# Patient Record
Sex: Male | Born: 1965 | Race: Black or African American | Hispanic: No | Marital: Married
Health system: Southern US, Community
[De-identification: ages and names within clinical notes are randomized; demographics above are authoritative.]

---

## 2004-04-20 ENCOUNTER — Inpatient Hospital Stay (HOSPITAL_COMMUNITY): Admission: EM | Admit: 2004-04-20 | Discharge: 2004-04-21 | Payer: Self-pay | Admitting: Emergency Medicine

## 2013-03-16 ENCOUNTER — Ambulatory Visit
Admission: RE | Admit: 2013-03-16 | Discharge: 2013-03-16 | Disposition: A | Discharge status: Home / Self Care | Payer: BC Managed Care – PPO | Source: Ambulatory Visit | Attending: Chiropractic Medicine | Admitting: Chiropractic Medicine

## 2013-03-16 ENCOUNTER — Other Ambulatory Visit: Payer: Self-pay | Admitting: Chiropractic Medicine

## 2013-03-16 DIAGNOSIS — M549 Dorsalgia, unspecified: Secondary | ICD-10-CM

## 2013-03-16 DIAGNOSIS — M542 Cervicalgia: Secondary | ICD-10-CM

## 2014-12-16 IMAGING — CR DG CERVICAL SPINE 2 OR 3 VIEWS
4 series · 4 of 4 positions shown · non-contrast
Comparison: None.

CLINICAL DATA: Neck pain. Right shoulder tenderness.  No trauma.

CERVICAL SPINE - 2-3 VIEW

[view not recorded (1 of 4)]
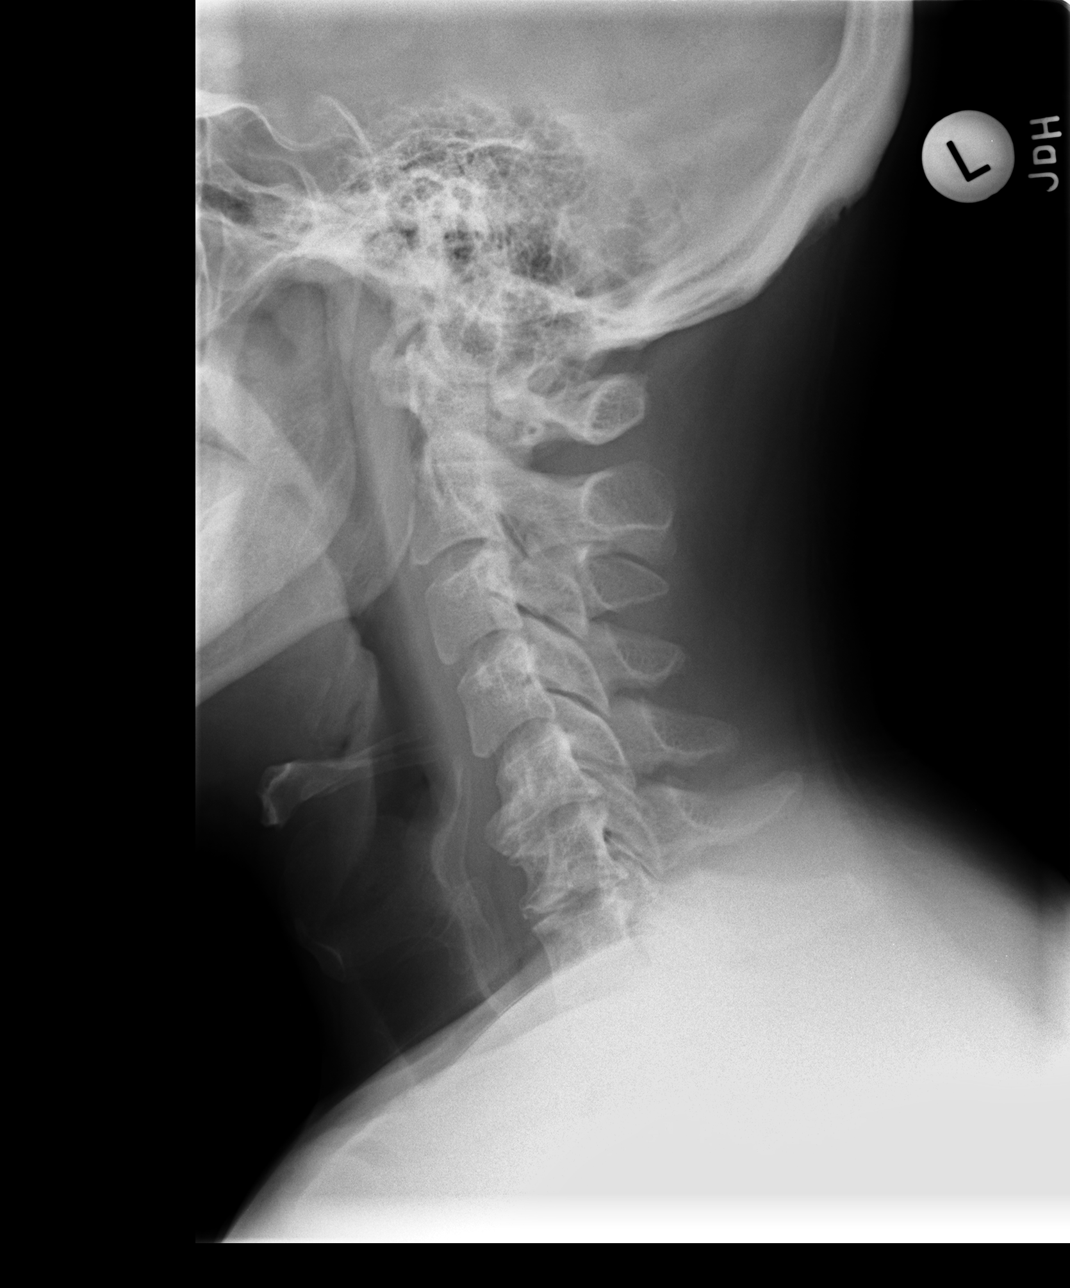

[view not recorded (2 of 4)]
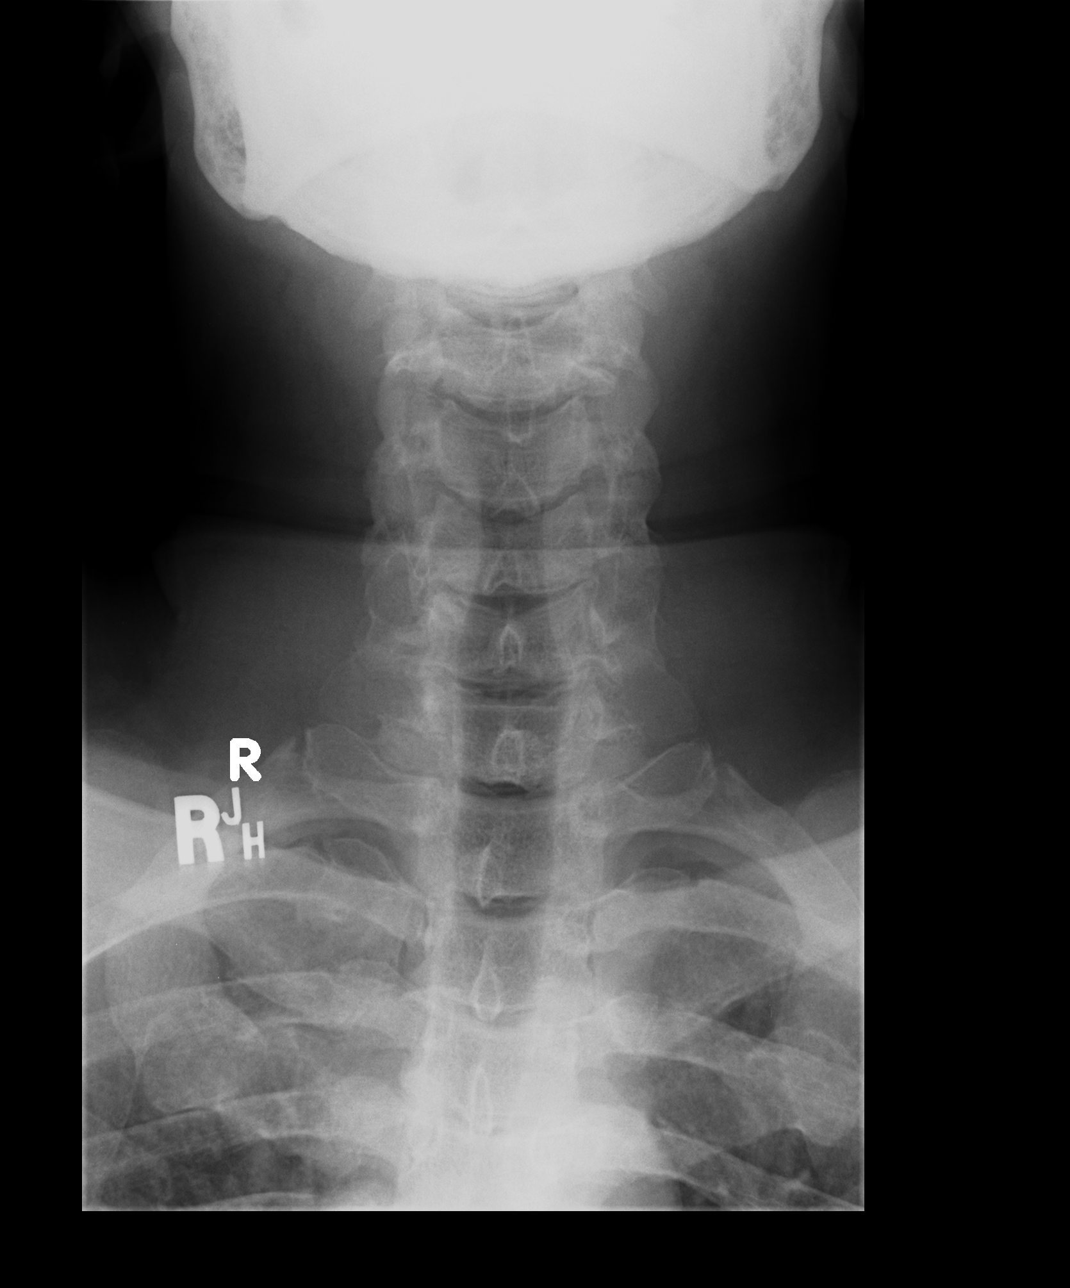

[view not recorded (3 of 4)]
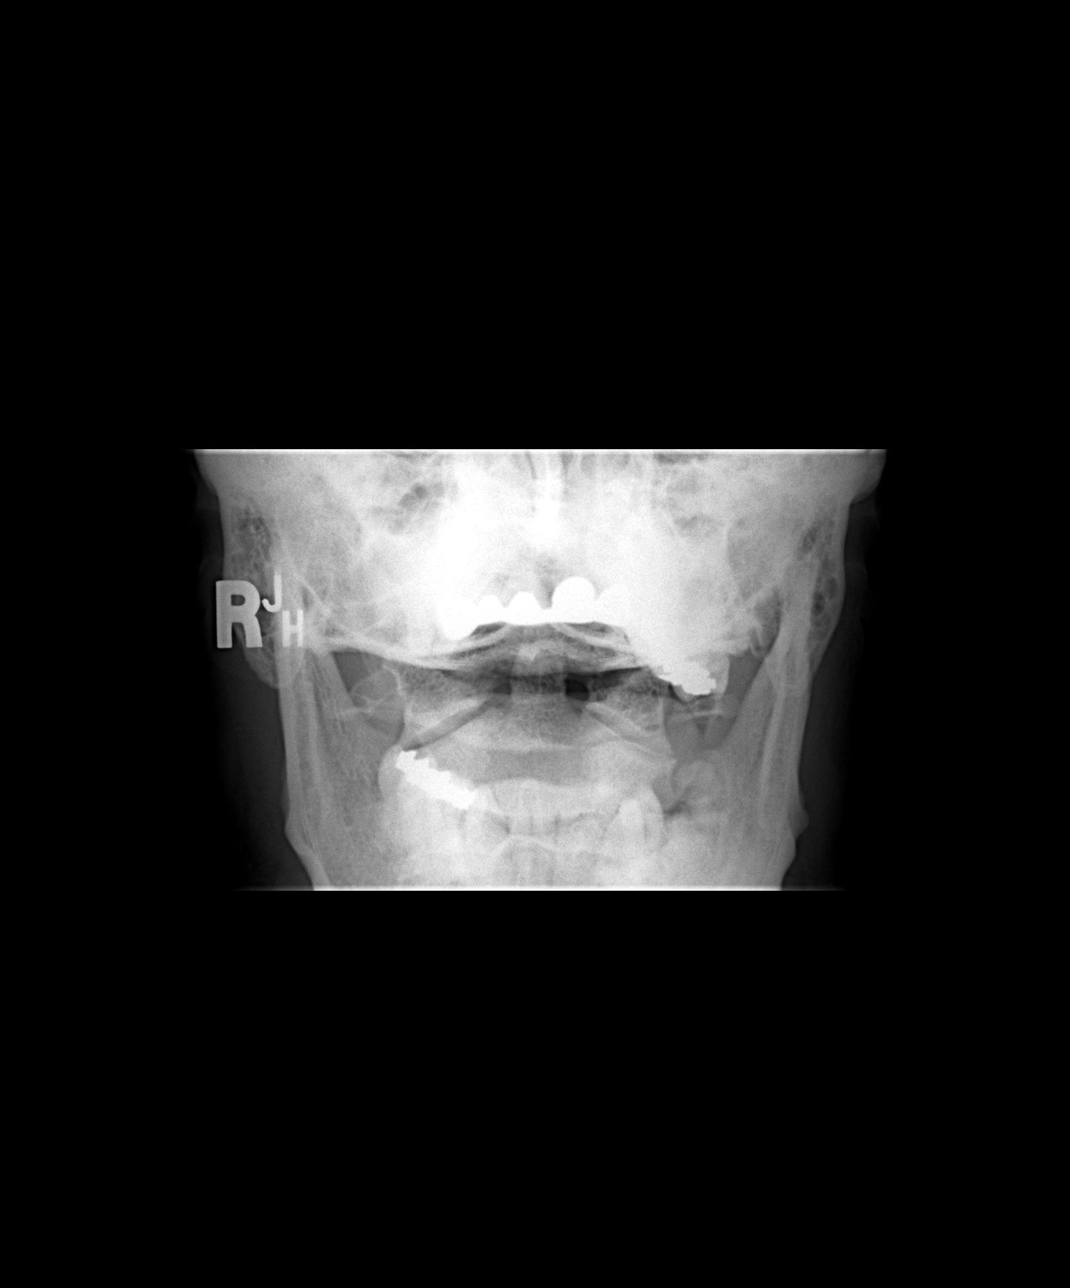

[view not recorded (4 of 4)]
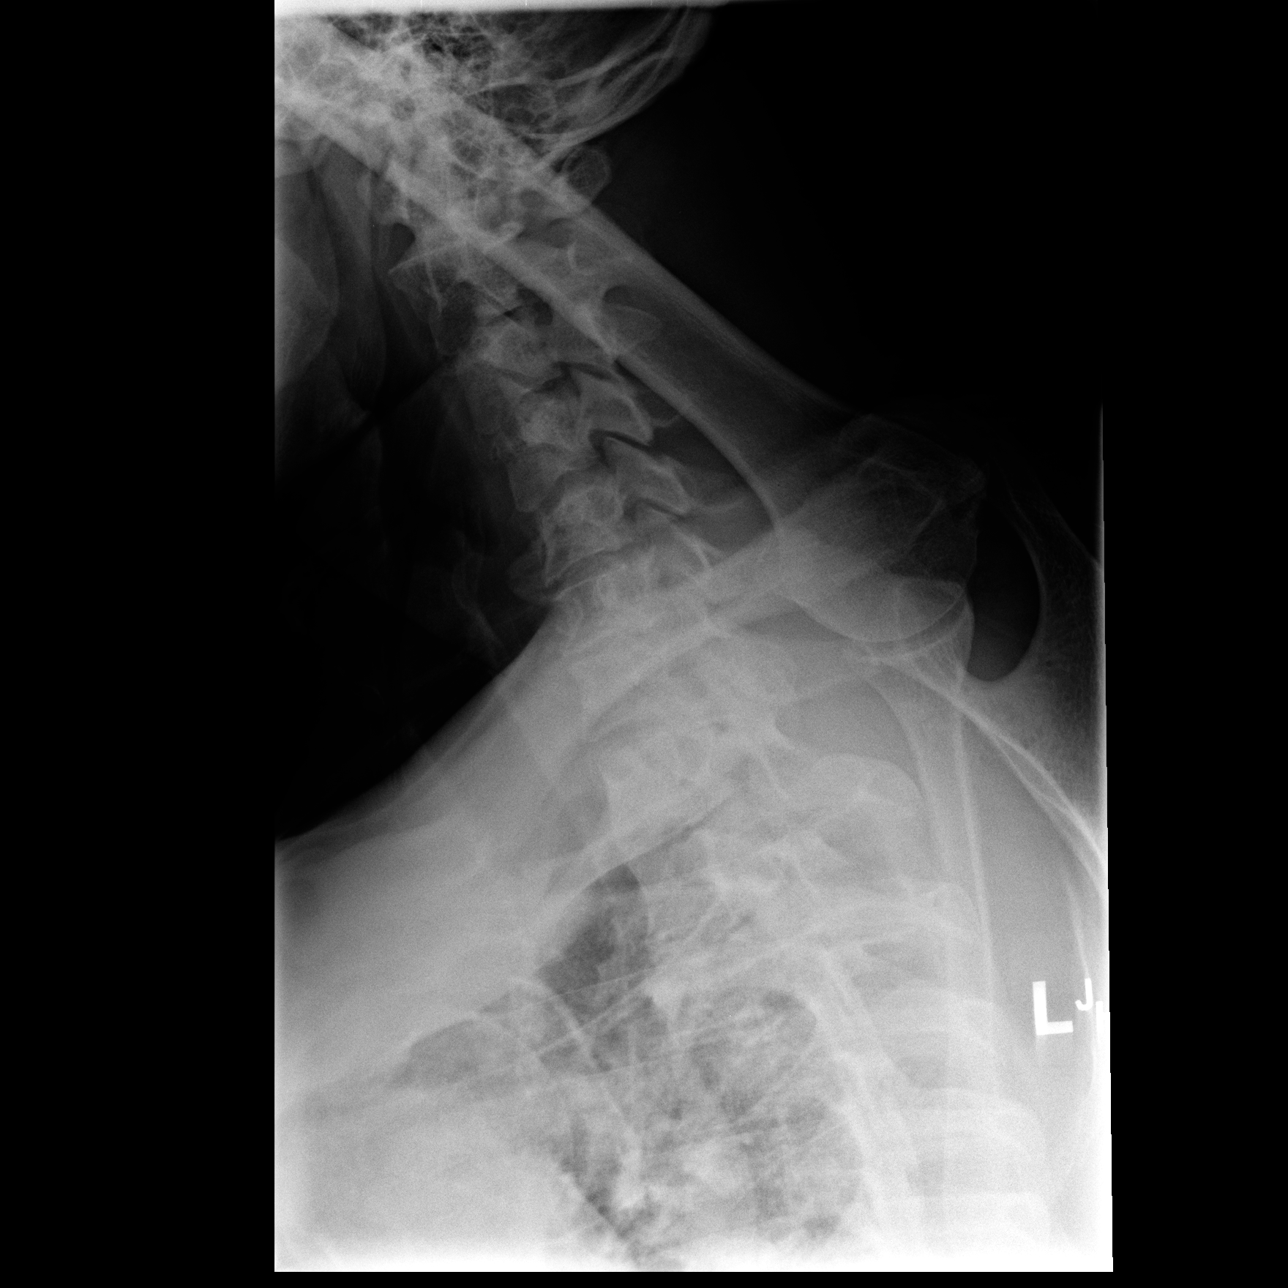

[4 of 4 positions shown; findings below may reference images not displayed]

FINDINGS: Straightening of the normal cervical lordosis.
Prevertebral soft tissues are normal.  Severe degenerative disc
disease is present at C5-C6 and C6-C7 with posterior disc
osteophyte complexes and marginal osteophytes.  The odontoid
appears normal.
IMPRESSION: Moderate to severe lower cervical spondylosis with severe
degenerative disc disease at C5-C6 and C6-C7.

## 2017-07-01 DIAGNOSIS — R0683 Snoring: Secondary | ICD-10-CM | POA: Diagnosis not present

## 2017-07-01 DIAGNOSIS — N529 Male erectile dysfunction, unspecified: Secondary | ICD-10-CM | POA: Diagnosis not present

## 2017-10-08 DIAGNOSIS — Z0000 Encounter for general adult medical examination without abnormal findings: Secondary | ICD-10-CM | POA: Diagnosis not present

## 2017-10-08 DIAGNOSIS — Z23 Encounter for immunization: Secondary | ICD-10-CM | POA: Diagnosis not present

## 2017-11-02 DIAGNOSIS — Z131 Encounter for screening for diabetes mellitus: Secondary | ICD-10-CM | POA: Diagnosis not present

## 2017-11-02 DIAGNOSIS — Z13220 Encounter for screening for lipoid disorders: Secondary | ICD-10-CM | POA: Diagnosis not present

## 2017-11-02 DIAGNOSIS — Z125 Encounter for screening for malignant neoplasm of prostate: Secondary | ICD-10-CM | POA: Diagnosis not present

## 2018-11-07 DIAGNOSIS — R0602 Shortness of breath: Secondary | ICD-10-CM | POA: Diagnosis not present

## 2018-11-07 DIAGNOSIS — M79604 Pain in right leg: Secondary | ICD-10-CM | POA: Diagnosis not present

## 2018-11-07 DIAGNOSIS — I2699 Other pulmonary embolism without acute cor pulmonale: Secondary | ICD-10-CM | POA: Diagnosis not present

## 2018-11-07 DIAGNOSIS — I2694 Multiple subsegmental pulmonary emboli without acute cor pulmonale: Secondary | ICD-10-CM | POA: Diagnosis not present

## 2018-11-07 DIAGNOSIS — I517 Cardiomegaly: Secondary | ICD-10-CM | POA: Diagnosis not present

## 2018-11-07 DIAGNOSIS — R000 Tachycardia, unspecified: Secondary | ICD-10-CM | POA: Diagnosis not present

## 2018-11-07 DIAGNOSIS — R1011 Right upper quadrant pain: Secondary | ICD-10-CM | POA: Diagnosis not present

## 2018-11-07 DIAGNOSIS — M549 Dorsalgia, unspecified: Secondary | ICD-10-CM | POA: Diagnosis not present

## 2018-11-07 DIAGNOSIS — I519 Heart disease, unspecified: Secondary | ICD-10-CM | POA: Diagnosis not present

## 2018-11-07 DIAGNOSIS — R079 Chest pain, unspecified: Secondary | ICD-10-CM | POA: Diagnosis not present

## 2018-11-07 DIAGNOSIS — I82451 Acute embolism and thrombosis of right peroneal vein: Secondary | ICD-10-CM | POA: Diagnosis not present

## 2018-11-07 DIAGNOSIS — M79605 Pain in left leg: Secondary | ICD-10-CM | POA: Diagnosis not present

## 2018-11-08 DIAGNOSIS — I519 Heart disease, unspecified: Secondary | ICD-10-CM | POA: Diagnosis not present

## 2018-11-08 DIAGNOSIS — I2699 Other pulmonary embolism without acute cor pulmonale: Secondary | ICD-10-CM | POA: Diagnosis not present

## 2018-11-08 DIAGNOSIS — I517 Cardiomegaly: Secondary | ICD-10-CM | POA: Diagnosis not present

## 2018-11-09 DIAGNOSIS — I2699 Other pulmonary embolism without acute cor pulmonale: Secondary | ICD-10-CM | POA: Diagnosis not present

## 2018-11-10 DIAGNOSIS — I2699 Other pulmonary embolism without acute cor pulmonale: Secondary | ICD-10-CM | POA: Diagnosis not present

## 2018-11-22 DIAGNOSIS — I2699 Other pulmonary embolism without acute cor pulmonale: Secondary | ICD-10-CM | POA: Diagnosis not present

## 2018-11-23 DIAGNOSIS — Z86711 Personal history of pulmonary embolism: Secondary | ICD-10-CM | POA: Diagnosis not present

## 2018-11-23 DIAGNOSIS — Z7901 Long term (current) use of anticoagulants: Secondary | ICD-10-CM | POA: Diagnosis not present

## 2018-11-23 DIAGNOSIS — I2699 Other pulmonary embolism without acute cor pulmonale: Secondary | ICD-10-CM | POA: Diagnosis not present

## 2018-11-23 DIAGNOSIS — Z09 Encounter for follow-up examination after completed treatment for conditions other than malignant neoplasm: Secondary | ICD-10-CM | POA: Diagnosis not present

## 2018-11-23 DIAGNOSIS — I82451 Acute embolism and thrombosis of right peroneal vein: Secondary | ICD-10-CM | POA: Diagnosis not present

## 2018-11-23 DIAGNOSIS — Z86718 Personal history of other venous thrombosis and embolism: Secondary | ICD-10-CM | POA: Diagnosis not present

## 2019-03-01 DIAGNOSIS — Z79899 Other long term (current) drug therapy: Secondary | ICD-10-CM | POA: Diagnosis not present

## 2019-03-01 DIAGNOSIS — Z7901 Long term (current) use of anticoagulants: Secondary | ICD-10-CM | POA: Diagnosis not present

## 2019-03-01 DIAGNOSIS — I2782 Chronic pulmonary embolism: Secondary | ICD-10-CM | POA: Diagnosis not present

## 2019-03-01 DIAGNOSIS — I82551 Chronic embolism and thrombosis of right peroneal vein: Secondary | ICD-10-CM | POA: Diagnosis not present

## 2019-08-30 DIAGNOSIS — Z1211 Encounter for screening for malignant neoplasm of colon: Secondary | ICD-10-CM | POA: Diagnosis not present

## 2019-08-30 DIAGNOSIS — I2782 Chronic pulmonary embolism: Secondary | ICD-10-CM | POA: Diagnosis not present

## 2019-08-30 DIAGNOSIS — Z7901 Long term (current) use of anticoagulants: Secondary | ICD-10-CM | POA: Diagnosis not present

## 2019-08-30 DIAGNOSIS — I82551 Chronic embolism and thrombosis of right peroneal vein: Secondary | ICD-10-CM | POA: Diagnosis not present

## 2019-09-23 ENCOUNTER — Ambulatory Visit: Payer: Self-pay | Attending: Internal Medicine

## 2019-09-23 DIAGNOSIS — Z23 Encounter for immunization: Secondary | ICD-10-CM

## 2019-09-23 NOTE — Progress Notes (Signed)
   Covid-19 Vaccination Clinic  Name:  Derek Harper    MRN: [id number] DOB: [birth year]  09/23/2019  [name] was observed post Covid-19 immunization for 15 minutes without incident. He was provided with Vaccine Information Sheet and instruction to access the V-Safe system.   [name] was instructed to call 911 with any severe reactions post vaccine: Marland Kitchen Difficulty breathing  . Swelling of face and throat  . A fast heartbeat  . A bad rash all over body  . Dizziness and weakness   Immunizations Administered    Name Date Dose VIS Date Route   Pfizer COVID-19 Vaccine 09/23/2019  8:26 AM 0.3 mL 06/23/2019 Intramuscular   Manufacturer: [employer], [employer]   Lot: [id number]   NDC: [address]-6

## 2019-10-16 ENCOUNTER — Ambulatory Visit: Payer: Self-pay

## 2019-10-16 ENCOUNTER — Ambulatory Visit: Payer: Self-pay | Attending: Internal Medicine

## 2019-10-16 DIAGNOSIS — Z23 Encounter for immunization: Secondary | ICD-10-CM

## 2019-10-16 NOTE — Progress Notes (Signed)
   Covid-19 Vaccination Clinic  Name:  Aviv Lengacher    MRN: [id number] DOB: [birth year]  10/16/2019  Mr. Griffith was observed post Covid-19 immunization for 15 minutes without incident. He was provided with Vaccine Information Sheet and instruction to access the V-Safe system.   Mr. Radigan was instructed to call 911 with any severe reactions post vaccine: Marland Kitchen Difficulty breathing  . Swelling of face and throat  . A fast heartbeat  . A bad rash all over body  . Dizziness and weakness   Immunizations Administered    Name Date Dose VIS Date Route   Pfizer COVID-19 Vaccine 10/16/2019  1:07 PM 0.3 mL 06/23/2019 Intramuscular   Manufacturer: [employer], [employer]   Lot: [id number]   NDC: [address]-5

## 2019-11-20 DIAGNOSIS — Z1211 Encounter for screening for malignant neoplasm of colon: Secondary | ICD-10-CM | POA: Diagnosis not present

## 2019-11-20 DIAGNOSIS — K648 Other hemorrhoids: Secondary | ICD-10-CM | POA: Diagnosis not present

## 2019-11-20 DIAGNOSIS — K635 Polyp of colon: Secondary | ICD-10-CM | POA: Diagnosis not present

## 2019-11-20 DIAGNOSIS — K6389 Other specified diseases of intestine: Secondary | ICD-10-CM | POA: Diagnosis not present

## 2019-11-20 DIAGNOSIS — K5730 Diverticulosis of large intestine without perforation or abscess without bleeding: Secondary | ICD-10-CM | POA: Diagnosis not present

## 2020-02-28 DIAGNOSIS — I2782 Chronic pulmonary embolism: Secondary | ICD-10-CM | POA: Diagnosis not present

## 2020-02-28 DIAGNOSIS — Z7901 Long term (current) use of anticoagulants: Secondary | ICD-10-CM | POA: Diagnosis not present

## 2020-02-28 DIAGNOSIS — I82551 Chronic embolism and thrombosis of right peroneal vein: Secondary | ICD-10-CM | POA: Diagnosis not present

## 2020-02-28 DIAGNOSIS — Z1211 Encounter for screening for malignant neoplasm of colon: Secondary | ICD-10-CM | POA: Diagnosis not present

## 2020-08-30 DIAGNOSIS — I82551 Chronic embolism and thrombosis of right peroneal vein: Secondary | ICD-10-CM | POA: Diagnosis not present

## 2020-08-30 DIAGNOSIS — I2782 Chronic pulmonary embolism: Secondary | ICD-10-CM | POA: Diagnosis not present

## 2020-08-30 DIAGNOSIS — Z7901 Long term (current) use of anticoagulants: Secondary | ICD-10-CM | POA: Diagnosis not present

## 2021-03-05 DIAGNOSIS — I82551 Chronic embolism and thrombosis of right peroneal vein: Secondary | ICD-10-CM | POA: Diagnosis not present

## 2021-03-05 DIAGNOSIS — Z7901 Long term (current) use of anticoagulants: Secondary | ICD-10-CM | POA: Diagnosis not present

## 2021-03-05 DIAGNOSIS — I2782 Chronic pulmonary embolism: Secondary | ICD-10-CM | POA: Diagnosis not present
